# Patient Record
Sex: Male | Born: 1980
Health system: Southern US, Community
[De-identification: ages and names within clinical notes are randomized; demographics above are authoritative.]

---

## 2017-04-27 DIAGNOSIS — J324 Chronic pansinusitis: Secondary | ICD-10-CM | POA: Diagnosis not present

## 2017-05-21 ENCOUNTER — Other Ambulatory Visit: Payer: Self-pay

## 2017-05-21 ENCOUNTER — Emergency Department (HOSPITAL_BASED_OUTPATIENT_CLINIC_OR_DEPARTMENT_OTHER): Payer: BLUE CROSS/BLUE SHIELD

## 2017-05-21 ENCOUNTER — Emergency Department (HOSPITAL_BASED_OUTPATIENT_CLINIC_OR_DEPARTMENT_OTHER)
Admission: EM | Admit: 2017-05-21 | Discharge: 2017-05-21 | Disposition: A | Discharge status: Home / Self Care | Payer: BLUE CROSS/BLUE SHIELD | Attending: Emergency Medicine | Admitting: Emergency Medicine

## 2017-05-21 DIAGNOSIS — R05 Cough: Secondary | ICD-10-CM | POA: Insufficient documentation

## 2017-05-21 DIAGNOSIS — R059 Cough, unspecified: Secondary | ICD-10-CM

## 2017-05-21 DIAGNOSIS — Z79899 Other long term (current) drug therapy: Secondary | ICD-10-CM | POA: Insufficient documentation

## 2017-05-21 MED ORDER — ALBUTEROL SULFATE HFA 108 (90 BASE) MCG/ACT IN AERS
1.0000 | INHALATION_SPRAY | RESPIRATORY_TRACT | Status: DC | PRN
  Administered 2017-05-21: 1 via RESPIRATORY_TRACT
  Filled 2017-05-21: qty 6.7

## 2017-05-21 NOTE — ED Triage Notes (Signed)
Pt. Has had a cough lingering since he has had the undiagnosed flu.  Was given an abx for the so called flu.  Pt. In no distress with VSS and Oxy sat of 100%.   Resp. Therapist listened to Pt. In triage.  Pt. In no distress.

## 2017-05-21 NOTE — Discharge Instructions (Signed)
Use inhaler as needed for cough Return if worsening

## 2017-05-21 NOTE — ED Triage Notes (Signed)
Pt. Has a dry cough on going for 2 weeks.  Pt. Reports he had the flu 3 wks ago and placed on a Z pak.

## 2017-05-21 NOTE — ED Provider Notes (Signed)
[facility] EMERGENCY DEPARTMENT Provider Note   CSN: [id number] Arrival date & time: 05/21/17  [id number]     History   Chief Complaint Chief Complaint  Patient presents with  . Cough    HPI James Howe is a 37 y.o. male who presents with a cough. No significant PMH. He states he had the flu a couple weeks ago. He was treated with a Zpack. All of his symptoms have resolved except for a dry hacking cough. Sometimes he will cough so much he will gag. He denies fever, URI symptoms, SOB, wheezing. He has been using his son's inhaler with good relief of his symptoms. He went to [facility] earlier but was worried he might have pneumonia so came here.   HPI  No past medical history on file.  There are no active problems to display for this patient.   The histories are not reviewed yet. Please review them in the "History" navigator section and refresh this SmartLink.     Home Medications    Prior to Admission medications   Medication Sig Start Date End Date Taking? Authorizing Provider  azithromycin (ZITHROMAX) 250 MG tablet Take by mouth daily.   Yes [provider]    Family History No family history on file.  Social History Social History   Tobacco Use  . Smoking status: Not on file  Substance Use Topics  . Alcohol use: Not on file  . Drug use: Not on file     Allergies   Patient has no allergy information on record.   Review of Systems Review of Systems  Constitutional: Negative for chills and fever.  HENT: Negative for congestion, rhinorrhea and sore throat.   Respiratory: Positive for cough. Negative for shortness of breath and wheezing.   Cardiovascular: Negative for chest pain.     Physical Exam Updated Vital Signs BP (!) 125/91 (BP Location: Right Arm)   Pulse 78   Temp 98.7 F (37.1 C) (Oral)   Resp 18   Ht 5\' 9"  (1.753 m)   Wt 66.7 kg (147 lb)   SpO2 99%   BMI 21.71 kg/m   Physical Exam  Constitutional: He is oriented to  person, place, and time. He appears well-developed and well-nourished. No distress.  HENT:  Head: Normocephalic and atraumatic.  Right Ear: Hearing, tympanic membrane, external ear and ear canal normal.  Left Ear: Hearing, tympanic membrane, external ear and ear canal normal.  Nose: Nose normal.  Mouth/Throat: Uvula is midline, oropharynx is clear and moist and mucous membranes are normal.  Eyes: Conjunctivae are normal. Pupils are equal, round, and reactive to light. Right eye exhibits no discharge. Left eye exhibits no discharge. No scleral icterus.  Neck: Normal range of motion.  Cardiovascular: Normal rate and regular rhythm. Exam reveals no gallop and no friction rub.  No murmur heard. Pulmonary/Chest: Effort normal and breath sounds normal. No stridor. No respiratory distress. He has no wheezes. He has no rales. He exhibits no tenderness.  Abdominal: He exhibits no distension.  Neurological: He is alert and oriented to person, place, and time.  Skin: Skin is warm and dry.  Psychiatric: He has a normal mood and affect. His behavior is normal.  Nursing note and vitals reviewed.    ED Treatments / Results  Labs (all labs ordered are listed, but only abnormal results are displayed) Labs Reviewed - No data to display  EKG  EKG Interpretation None       Radiology Dg Chest 2 View  Result Date: 05/21/2017 CLINICAL DATA:  Flu 3 weeks ago.  Persistent cough.  No fever. EXAM: CHEST  2 VIEW COMPARISON:  None. FINDINGS: Normal heart size and mediastinal contours. No acute infiltrate or edema. No effusion or pneumothorax. No acute osseous findings. IMPRESSION: Negative chest. Electronically Signed   By: Marnee Spring M.D.   On: 05/21/2017 20:27    Procedures Procedures (including critical care time)  Medications Ordered in ED Medications  albuterol (PROVENTIL HFA;VENTOLIN HFA) 108 (90 Base) MCG/ACT inhaler 1-2 puff (not administered)     Initial Impression / Assessment and  Plan / ED Course  I have reviewed the triage vital signs and the nursing notes.  Pertinent labs & imaging results that were available during my care of the patient were reviewed by me and considered in my medical decision making (see chart for details).  37 year old male presents with a cough. Likely residual from URI a couple weeks ago. Vitals are normal. He is well appearing and in NAD. CXR is negative. He reports improvement with his son's inhaler at home. Will give him an inhaler and return precautions.  Final Clinical Impressions(s) / ED Diagnoses   Final diagnoses:  Cough    ED Discharge Orders    None       Bethel Born, PA-C 05/21/17 2233    Arby Barrette, MD 05/25/17 1800

## 2018-02-10 IMAGING — CR DG CHEST 2V
2 series · 2 of 2 positions shown · non-contrast
Comparison: None.

CLINICAL DATA: Flu 3 weeks ago.  Persistent cough.  No fever.

EXAM:
CHEST  2 VIEW

[w chest pa]
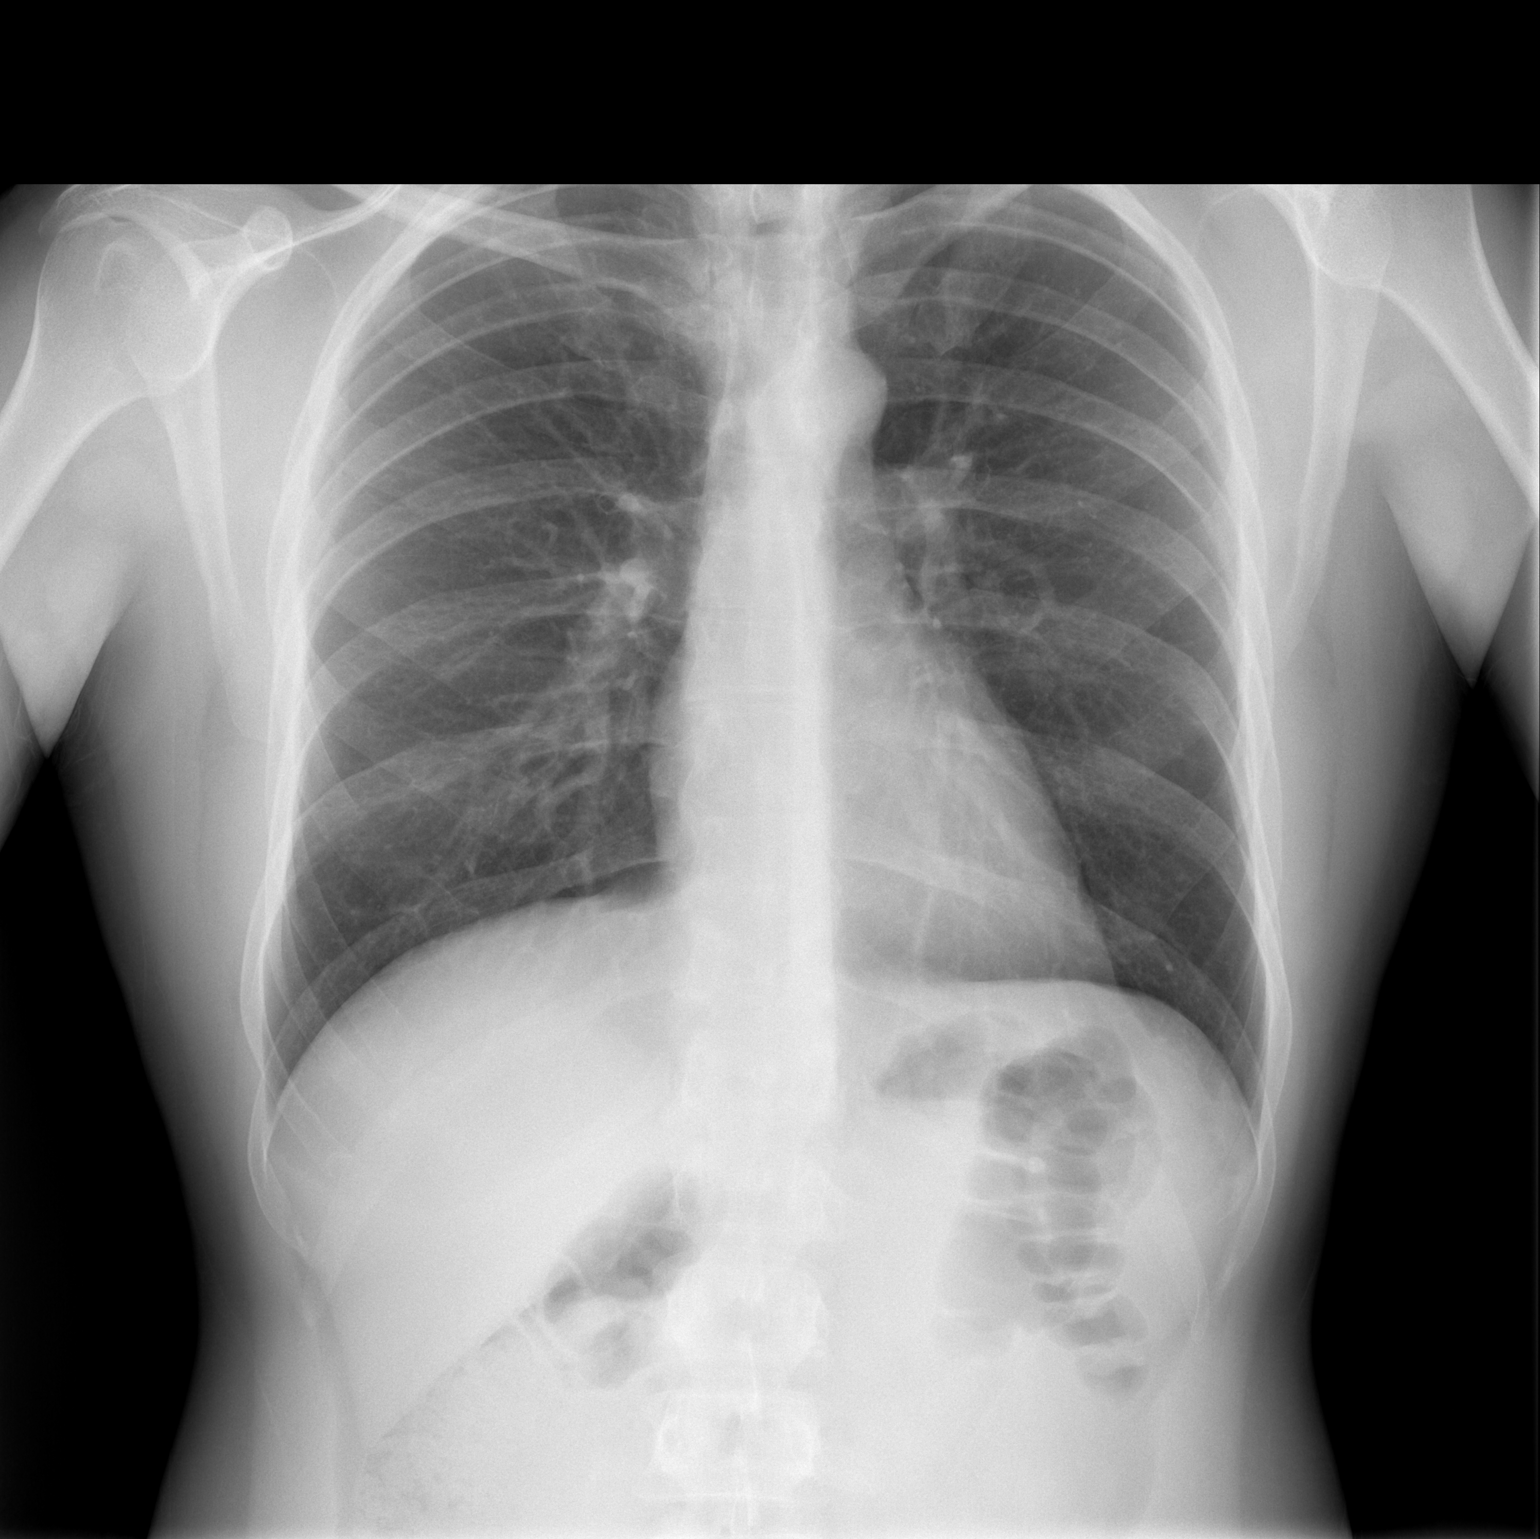

[w chest lat]
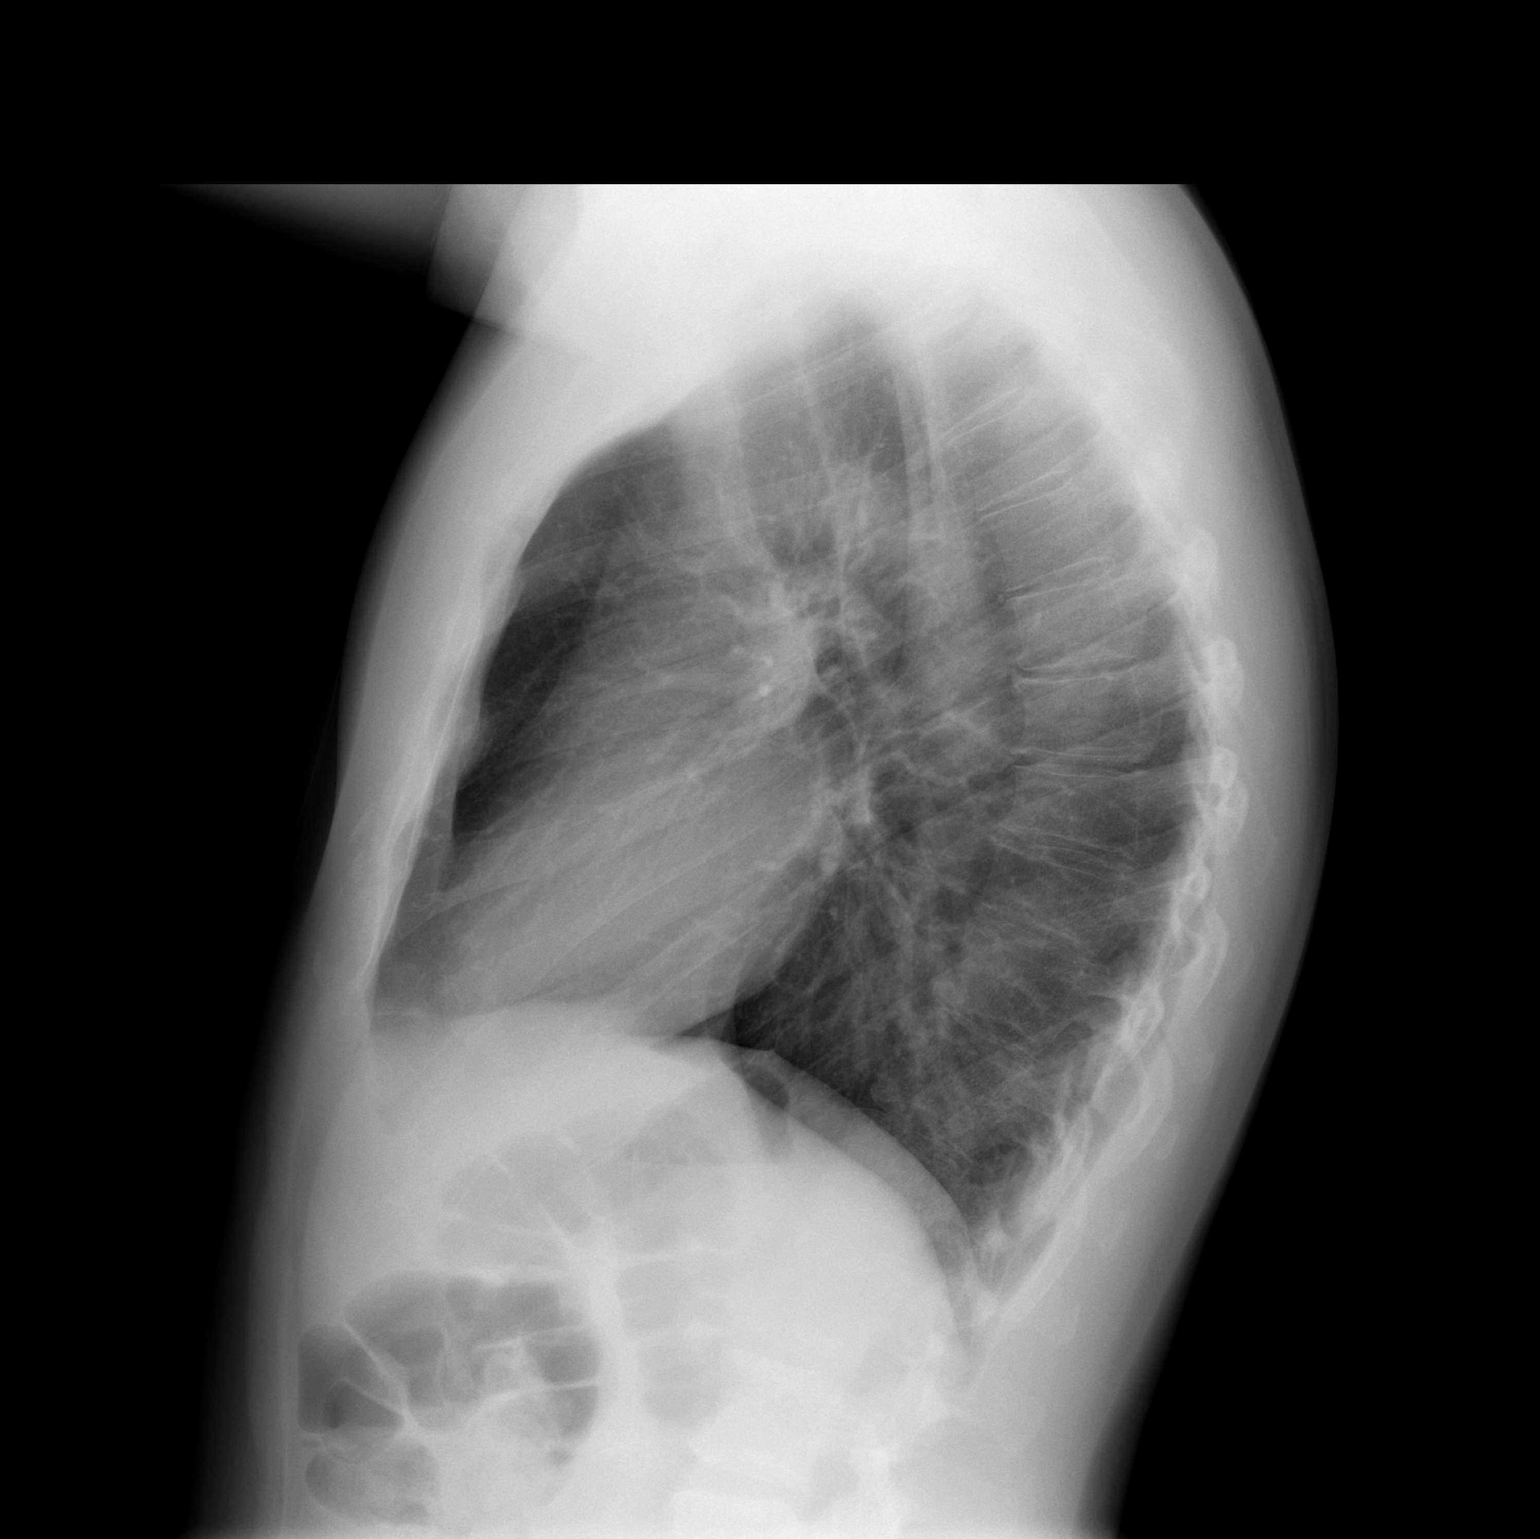

[2 of 2 positions shown; findings below may reference images not displayed]

FINDINGS: Normal heart size and mediastinal contours. No acute infiltrate or
edema. No effusion or pneumothorax. No acute osseous findings.
IMPRESSION: Negative chest.

## 2018-10-27 DIAGNOSIS — Z6822 Body mass index (BMI) 22.0-22.9, adult: Secondary | ICD-10-CM | POA: Diagnosis not present

## 2018-10-27 DIAGNOSIS — K219 Gastro-esophageal reflux disease without esophagitis: Secondary | ICD-10-CM | POA: Diagnosis not present

## 2018-10-27 DIAGNOSIS — R51 Headache: Secondary | ICD-10-CM | POA: Diagnosis not present

## 2018-12-15 DIAGNOSIS — K219 Gastro-esophageal reflux disease without esophagitis: Secondary | ICD-10-CM | POA: Diagnosis not present

## 2018-12-15 DIAGNOSIS — R51 Headache: Secondary | ICD-10-CM | POA: Diagnosis not present

## 2019-03-13 DIAGNOSIS — R21 Rash and other nonspecific skin eruption: Secondary | ICD-10-CM | POA: Diagnosis not present

## 2019-07-27 DIAGNOSIS — Z6822 Body mass index (BMI) 22.0-22.9, adult: Secondary | ICD-10-CM | POA: Diagnosis not present

## 2019-07-27 DIAGNOSIS — R519 Headache, unspecified: Secondary | ICD-10-CM | POA: Diagnosis not present

## 2019-07-27 DIAGNOSIS — K219 Gastro-esophageal reflux disease without esophagitis: Secondary | ICD-10-CM | POA: Diagnosis not present

## 2019-12-14 DIAGNOSIS — J019 Acute sinusitis, unspecified: Secondary | ICD-10-CM | POA: Diagnosis not present

## 2020-03-28 DIAGNOSIS — G43009 Migraine without aura, not intractable, without status migrainosus: Secondary | ICD-10-CM | POA: Diagnosis not present

## 2020-03-28 DIAGNOSIS — K219 Gastro-esophageal reflux disease without esophagitis: Secondary | ICD-10-CM | POA: Diagnosis not present

## 2020-03-28 DIAGNOSIS — G43001 Migraine without aura, not intractable, with status migrainosus: Secondary | ICD-10-CM | POA: Diagnosis not present

## 2020-03-28 DIAGNOSIS — Z6823 Body mass index (BMI) 23.0-23.9, adult: Secondary | ICD-10-CM | POA: Diagnosis not present

## 2021-08-19 DIAGNOSIS — K219 Gastro-esophageal reflux disease without esophagitis: Secondary | ICD-10-CM | POA: Diagnosis not present

## 2021-08-19 DIAGNOSIS — Z6822 Body mass index (BMI) 22.0-22.9, adult: Secondary | ICD-10-CM | POA: Diagnosis not present

## 2021-08-19 DIAGNOSIS — G43009 Migraine without aura, not intractable, without status migrainosus: Secondary | ICD-10-CM | POA: Diagnosis not present

## 2022-04-29 DIAGNOSIS — G43009 Migraine without aura, not intractable, without status migrainosus: Secondary | ICD-10-CM | POA: Diagnosis not present

## 2022-04-29 DIAGNOSIS — K219 Gastro-esophageal reflux disease without esophagitis: Secondary | ICD-10-CM | POA: Diagnosis not present

## 2023-01-12 DIAGNOSIS — G43009 Migraine without aura, not intractable, without status migrainosus: Secondary | ICD-10-CM | POA: Diagnosis not present

## 2023-01-12 DIAGNOSIS — K219 Gastro-esophageal reflux disease without esophagitis: Secondary | ICD-10-CM | POA: Diagnosis not present

## 2023-12-09 DIAGNOSIS — E7800 Pure hypercholesterolemia, unspecified: Secondary | ICD-10-CM | POA: Diagnosis not present

## 2023-12-09 DIAGNOSIS — E785 Hyperlipidemia, unspecified: Secondary | ICD-10-CM | POA: Diagnosis not present

## 2023-12-09 DIAGNOSIS — G43009 Migraine without aura, not intractable, without status migrainosus: Secondary | ICD-10-CM | POA: Diagnosis not present

## 2023-12-09 DIAGNOSIS — K219 Gastro-esophageal reflux disease without esophagitis: Secondary | ICD-10-CM | POA: Diagnosis not present

## 2023-12-09 DIAGNOSIS — Z6823 Body mass index (BMI) 23.0-23.9, adult: Secondary | ICD-10-CM | POA: Diagnosis not present
